# Patient Record
Sex: Female | Born: 1985 | Race: Black or African American | Hispanic: No | Marital: Single | Smoking: Former smoker
Health system: Southern US, Community
[De-identification: ages and names within clinical notes are randomized; demographics above are authoritative.]

## PROBLEM LIST (undated history)

## (undated) HISTORY — PX: FINGER SURGERY: SHX640

---
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
## 2006-11-13 ENCOUNTER — Ambulatory Visit: Payer: Self-pay | Admitting: Internal Medicine

## 2006-11-13 ENCOUNTER — Ambulatory Visit (HOSPITAL_COMMUNITY): Admission: RE | Admit: 2006-11-13 | Discharge: 2006-11-13 | Payer: Self-pay | Admitting: Family Medicine

## 2010-04-06 ENCOUNTER — Emergency Department (HOSPITAL_COMMUNITY)
Admission: EM | Admit: 2010-04-06 | Discharge: 2010-04-06 | Payer: Self-pay | Source: Home / Self Care | Admitting: Family Medicine

## 2011-12-11 ENCOUNTER — Encounter (HOSPITAL_COMMUNITY): Payer: Self-pay | Admitting: *Deleted

## 2011-12-11 ENCOUNTER — Emergency Department (HOSPITAL_COMMUNITY)
Admission: EM | Admit: 2011-12-11 | Discharge: 2011-12-11 | Disposition: A | Discharge status: Home / Self Care | Payer: No Typology Code available for payment source | Attending: Emergency Medicine | Admitting: Emergency Medicine

## 2011-12-11 ENCOUNTER — Emergency Department (HOSPITAL_COMMUNITY): Payer: No Typology Code available for payment source

## 2011-12-11 DIAGNOSIS — Z87891 Personal history of nicotine dependence: Secondary | ICD-10-CM | POA: Insufficient documentation

## 2011-12-11 DIAGNOSIS — Z043 Encounter for examination and observation following other accident: Secondary | ICD-10-CM | POA: Insufficient documentation

## 2011-12-11 DIAGNOSIS — M25569 Pain in unspecified knee: Secondary | ICD-10-CM | POA: Diagnosis not present

## 2011-12-11 DIAGNOSIS — M25561 Pain in right knee: Secondary | ICD-10-CM
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
## 2011-12-12 NOTE — ED Provider Notes (Signed)
Medical screening examination/treatment/procedure(s) were performed by non-physician practitioner and as supervising physician I was immediately available for consultation/collaboration.  Toy Baker, MD 12/12/11 [phone]
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
## 2013-07-03 ENCOUNTER — Encounter (HOSPITAL_COMMUNITY): Payer: Self-pay | Admitting: Emergency Medicine

## 2013-07-03 ENCOUNTER — Emergency Department (HOSPITAL_COMMUNITY)
Admission: EM | Admit: 2013-07-03 | Discharge: 2013-07-03 | Disposition: A | Discharge status: Home / Self Care | Payer: No Typology Code available for payment source | Attending: Emergency Medicine | Admitting: Emergency Medicine

## 2013-07-03 DIAGNOSIS — K029 Dental caries, unspecified: Secondary | ICD-10-CM | POA: Insufficient documentation

## 2013-07-03 DIAGNOSIS — K0889 Other specified disorders of teeth and supporting structures: Secondary | ICD-10-CM

## 2013-07-03 DIAGNOSIS — Z87891 Personal history of nicotine dependence: Secondary | ICD-10-CM | POA: Insufficient documentation

## 2013-07-03 DIAGNOSIS — K089 Disorder of teeth and supporting structures, unspecified: Secondary | ICD-10-CM | POA: Insufficient documentation

## 2013-07-03 MED ORDER — MORPHINE SULFATE 4 MG/ML IJ SOLN: 4.0000 mg | Freq: Once | INTRAMUSCULAR | Status: DC

## 2013-07-03 MED ORDER — NAPROXEN 500 MG PO TABS: 500.0000 mg | ORAL_TABLET | Freq: Two times a day (BID) | ORAL | Status: DC

## 2013-07-03 MED ORDER — PENICILLIN V POTASSIUM 500 MG PO TABS: 500.0000 mg | ORAL_TABLET | Freq: Three times a day (TID) | ORAL | Status: DC

## 2013-07-03 MED ORDER — HYDROCODONE-ACETAMINOPHEN 5-325 MG PO TABS
1.0000 | ORAL_TABLET | Freq: Once | ORAL | Status: AC
  Administered 2013-07-03: 1 via ORAL
  Filled 2013-07-03: qty 1

## 2013-07-03 MED ORDER — HYDROCODONE-ACETAMINOPHEN 5-325 MG PO TABS: ORAL_TABLET | ORAL | Status: DC

## 2013-07-03 MED ORDER — ONDANSETRON HCL 4 MG/2ML IJ SOLN: 4.0000 mg | Freq: Once | INTRAMUSCULAR | Status: DC

## 2013-07-03 NOTE — ED Notes (Signed)
Patient presents stating she has been having tooth pain for about 2 weeks.  Has a bad tooth on the top and bottom left.

## 2013-07-03 NOTE — ED Provider Notes (Signed)
Medical screening examination/treatment/procedure(s) were performed by non-physician practitioner and as supervising physician I was immediately available for consultation/collaboration.   EKG Interpretation None       Olivia Mackielga M Tianni Escamilla, MD 07/03/13 1729

## 2013-07-03 NOTE — ED Provider Notes (Signed)
CSN: [id number]     Arrival date & time 07/03/13  [id number] History   First MD Initiated Contact with Patient 07/03/13 [phone]     Chief Complaint  Patient presents with  . Dental Pain     (Consider location/radiation/quality/duration/timing/severity/associated sxs/prior Treatment) HPI Comments: Patient presents with complaint of dental pain which has been intermittent for approximately 2 weeks. The pain is in her right upper and lower molars. She denies fever, neck swelling, trouble breathing. She has had some mild facial swelling at times, not currently, relieved with Tylenol. The onset of this condition was acute. The course is constant. Aggravating factors: none.  Patient is a 28 y.o. female presenting with tooth pain. The history is provided by the patient.  Dental Pain Associated symptoms: facial swelling   Associated symptoms: no fever, no headaches and no neck pain     History reviewed. No pertinent past medical history. Past Surgical History  Procedure Laterality Date  . Finger surgery     History reviewed. No pertinent family history. History  Substance Use Topics  . Smoking status: Former Games developermoker  . Smokeless tobacco: Never Used  . Alcohol Use: No   OB History   Grav Para Term Preterm Abortions TAB SAB Ect Mult Living                 Review of Systems  Constitutional: Negative for fever.  HENT: Positive for dental problem and facial swelling. Negative for ear pain, sore throat and trouble swallowing.   Respiratory: Negative for shortness of breath and stridor.   Musculoskeletal: Negative for neck pain.  Skin: Negative for color change.  Neurological: Negative for headaches.      Allergies  Review of patient's allergies indicates no known allergies.  Home Medications   Current Outpatient Rx  Name  Route  Sig  Dispense  Refill  . HYDROcodone-acetaminophen (NORCO/VICODIN) 5-325 MG per tablet      Take 1-2 tablets every 6 hours as needed for severe pain   12  tablet   0   . naproxen (NAPROSYN) 500 MG tablet   Oral   Take 1 tablet (500 mg total) by mouth 2 (two) times daily.   20 tablet   0   . penicillin v potassium (VEETID) 500 MG tablet   Oral   Take 1 tablet (500 mg total) by mouth 3 (three) times daily.   21 tablet   0    BP 143/90  Pulse 81  Temp(Src) 98 F (36.7 C) (Oral)  Resp 18  Ht 5\' 5"  (1.651 m)  Wt 220 lb (99.791 kg)  BMI 36.61 kg/m2  SpO2 97%  LMP 05/30/2013 Physical Exam  Nursing note and vitals reviewed. Constitutional: She appears well-developed and well-nourished.  HENT:  Head: Normocephalic and atraumatic.  Right Ear: Tympanic membrane, external ear and ear canal normal.  Left Ear: Tympanic membrane, external ear and ear canal normal.  Nose: Nose normal.  Mouth/Throat: Uvula is midline, oropharynx is clear and moist and mucous membranes are normal. No trismus in the jaw. Abnormal dentition. Dental caries present. No dental abscesses or uvula swelling. No tonsillar abscesses.  Patient with R maxillary and mandibular tooth pain and tenderness to palpation in area of 3rd molars. Multiple caries noted especially of left molars. No swelling or erythema noted on exam.  Eyes: Conjunctivae are normal.  Neck: Normal range of motion. Neck supple.  No neck swelling or Ludwig's angina  Lymphadenopathy:    She has no cervical adenopathy.  Neurological: She is alert.  Skin: Skin is warm and dry.  Psychiatric: She has a normal mood and affect.    ED Course  Procedures (including critical care time) Labs Review Labs Reviewed - No data to display Imaging Review No results found.   EKG Interpretation None      6:48 AM Patient seen and examined. Medications ordered.   Vital signs reviewed and are as follows: Filed Vitals:   07/03/13 0645  BP: 143/90  Pulse: 81  Temp:   Resp: 18   Patient counseled to take prescribed medications as directed, return with worsening facial or neck swelling, and to follow-up  with their dentist as soon as possible.   Antibiotics if facial swelling returns.   Patient counseled on use of narcotic pain medications. Counseled not to combine these medications with others containing tylenol. Urged not to drink alcohol, drive, or perform any other activities that requires focus while taking these medications. The patient verbalizes understanding and agrees with the plan.   MDM   Final diagnoses:  Pain, dental   Patient with toothache. No fever. Exam unconcerning for Ludwig's angina or other deep tissue infection in neck.   No facial swelling at current time or other evidence of infection -- but given report of facial swelling, patient rx penicillin and will fill if she develops facial swelling.      Renne Crigler, PA-C 07/03/13 [phone]

## 2013-07-03 NOTE — Discharge Instructions (Signed)
Please read and follow all provided instructions.  Your diagnoses today include:  1. Pain, dental     The exam and treatment you received today has been provided on an emergency basis only. This is not a substitute for complete medical or dental care.  Tests performed today include:  Vital signs. See below for your results today.   Medications prescribed:   Vicodin (hydrocodone/acetaminophen) - narcotic pain medication  DO NOT drive or perform any activities that require you to be awake and alert because this medicine can make you drowsy. BE VERY CAREFUL not to take multiple medicines containing Tylenol (also called acetaminophen). Doing so can lead to an overdose which can damage your liver and cause liver failure and possibly death.   Naproxen - anti-inflammatory pain medication  Do not exceed 500mg  naproxen every 12 hours, take with food  You have been prescribed an anti-inflammatory medication or NSAID. Take with food. Take smallest effective dose for the shortest duration needed for your pain. Stop taking if you experience stomach pain or vomiting.    Penicillin - antibiotic, fill if you have facial swelling  You have been prescribed an antibiotic medicine: take the entire course of medicine even if you are feeling better. Stopping early can cause the antibiotic not to work.  Take any prescribed medications only as directed.  Home care instructions:  Follow any educational materials contained in this packet.  Follow-up instructions: Please follow-up with your dentist for further evaluation of your symptoms. If you do not have a dentist or primary care doctor -- see below for referral information.   Dental Assistance: See below for dental referrals  Return instructions:   Please return to the Emergency Department if you experience worsening symptoms.  Please return if you develop a fever, you develop more swelling in your face or neck, you have trouble breathing or  swallowing food.  Please return if you have any other emergent concerns.  Additional Information:  Your vital signs today were: BP 123/83   Pulse 84   Temp(Src) 98 F (36.7 C) (Oral)   Resp 18   Ht 5\' 5"  (1.651 m)   Wt 220 lb (99.791 kg)   BMI 36.61 kg/m2   SpO2 100%   LMP 05/30/2013 If your blood pressure (BP) was elevated above 135/85 this visit, please have this repeated by your doctor within one month. -------------- Dental Care: Organization         Address  Phone  Notes  [facility] Department of [facility] [address] [place], [place] [phone] Accepts children up to age 16 who are enrolled in [place] or [place] Health Choice; pregnant women with a Medicaid card; and children who have applied for Medicaid or [place] Health Choice, but were declined, whose parents can pay a reduced fee at time of service.  [facility] Department of [facility] [place]  [address], [place] [phone] Accepts children up to age 37 who are enrolled in [place] or [place] Health Choice; pregnant women with a Medicaid card; and children who have applied for Medicaid or [place] Health Choice, but were declined, whose parents can pay a reduced fee at time of service.  [facility]  [address] [place], [place] [phone] Patients are seen by appointment only. Walk-ins are not accepted. [facility] will see patients 34 years of age and older. Monday - Tuesday (8am-5pm) Most Wednesdays (8:30-5pm) $30 per visit, cash only  [facility]  [address], [facility] [place] [facility] [phone] Patients are seen by appointment only. Walk-ins are not accepted. [facility] will see patients 27 years of age and older. One Wednesday Evening (Monthly: Volunteer Based).  $30 per visit, cash only  [facility]  [phone] for adults; Children under age 74, call Graduate Pediatric Dentistry at [phone]  [phone]. Children aged 934-14, please call [phone] to request a pediatric application.  Dental services are provided in all areas of dental care including fillings, crowns and bridges, complete and partial dentures, implants, gum treatment, root canals, and extractions. Preventive care is also provided. Treatment is provided to both adults and children. Patients are selected via a lottery and there is often a waiting list.   [facility] [address], [place]  [phone] [email or web address]   [facility] [address], [place], [place] [phone], [phone]. [phone] Second and Fourth Thursday of each month, opens at 6:30 AM; Clinic ends at 9 AM.  Patients are seen on a first-come first-served basis, and a limited number are seen during each clinic.   [facility]  [address], [place], [place] [phone]   Eligibility Requirements You must have lived in [place], [place], or [place] for at least the last three months.   You cannot be eligible for state or federal sponsored [place] insurance, including [employer], [place], or [employer]icare.   You generally cannot be eligible for healthcare insurance through your employer.    How to apply: Eligibility screenings are held every Tuesday and Wednesday afternoon from 1:00 pm until 4:00 pm. You do not need an appointment for the interview!  [facility] [address], [place], [place] [phone]   [facility]  [phone]   [facility]  [phone]   [facility]  [phone]
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
## 2013-12-04 ENCOUNTER — Encounter (HOSPITAL_COMMUNITY): Payer: Self-pay | Admitting: Emergency Medicine

## 2013-12-04 ENCOUNTER — Emergency Department (HOSPITAL_COMMUNITY)
Admission: EM | Admit: 2013-12-04 | Discharge: 2013-12-04 | Disposition: A | Discharge status: Home / Self Care | Payer: No Typology Code available for payment source | Attending: Emergency Medicine | Admitting: Emergency Medicine

## 2013-12-04 DIAGNOSIS — Z79899 Other long term (current) drug therapy: Secondary | ICD-10-CM | POA: Insufficient documentation

## 2013-12-04 DIAGNOSIS — K0889 Other specified disorders of teeth and supporting structures: Secondary | ICD-10-CM

## 2013-12-04 DIAGNOSIS — K089 Disorder of teeth and supporting structures, unspecified: Secondary | ICD-10-CM | POA: Insufficient documentation

## 2013-12-04 DIAGNOSIS — Z87891 Personal history of nicotine dependence: Secondary | ICD-10-CM | POA: Insufficient documentation

## 2013-12-04 MED ORDER — TRAMADOL HCL 50 MG PO TABS: 50.0000 mg | ORAL_TABLET | Freq: Four times a day (QID) | ORAL | Status: DC | PRN

## 2013-12-04 MED ORDER — HYDROCODONE-ACETAMINOPHEN 5-325 MG PO TABS
2.0000 | ORAL_TABLET | Freq: Once | ORAL | Status: AC
  Administered 2013-12-04: 2 via ORAL
  Filled 2013-12-04: qty 2

## 2013-12-04 NOTE — ED Provider Notes (Signed)
CSN: [id number]     Arrival date & time 12/04/13  1429 History  This chart was scribed for Sharilyn SitesLisa Sanders, working with Ethelda ChickMartha K Linker, MD found by Elon SpannerGarrett Fishbaugh, ED Scribe. This patient was seen in room WTR7/WTR7 and the patient's care was started at 3:12 PM.    Chief Complaint  Patient presents with  . Dental Pain   The history is provided by the patient. No language interpreter was used.    HPI Comments: Karen Roy is a 28 y.o. female who presents to the Emergency Department complaining of rapidly worsening lower left dental pain onset today.  She reports that her jaw feels swollen near the affected area currently.  She reports taking tylenol within the hour.  Patient denies fever, chills.  NKA.  Patient is not currently established with dentist.    History reviewed. No pertinent past medical history. Past Surgical History  Procedure Laterality Date  . Finger surgery     No family history on file. History  Substance Use Topics  . Smoking status: Former Games developermoker  . Smokeless tobacco: Never Used  . Alcohol Use: No   OB History   Grav Para Term Preterm Abortions TAB SAB Ect Mult Living                 Review of Systems  HENT: Positive for dental problem.   All other systems reviewed and are negative.     Allergies  Review of patient's allergies indicates no known allergies.  Home Medications   Prior to Admission medications   Medication Sig Start Date End Date Taking? Authorizing Provider  HYDROcodone-acetaminophen (NORCO/VICODIN) 5-325 MG per tablet Take 1-2 tablets every 6 hours as needed for severe pain 07/03/13   Renne CriglerJoshua Geiple, PA-C  naproxen (NAPROSYN) 500 MG tablet Take 1 tablet (500 mg total) by mouth 2 (two) times daily. 07/03/13   Renne CriglerJoshua Geiple, PA-C  penicillin v potassium (VEETID) 500 MG tablet Take 1 tablet (500 mg total) by mouth 3 (three) times daily. 07/03/13   Renne CriglerJoshua Geiple, PA-C   BP 151/78  Pulse 77  Temp(Src) 98.6 F (37 C) (Oral)  Resp 16  SpO2  100%  LMP 12/01/2013 Physical Exam  Nursing note and vitals reviewed. Constitutional: She is oriented to person, place, and time. She appears well-developed and well-nourished. No distress.  HENT:  Head: Normocephalic and atraumatic.  Mouth/Throat: Uvula is midline, oropharynx is clear and moist and mucous membranes are normal. Abnormal dentition. Dental caries present. No dental abscesses.  Teeth largely in poor dentition, left lower molar with large cavity present, surrounding gingiva normal in appearance without signs of dental abscess, handling secretions appropriately, no trismus  Eyes: Conjunctivae and EOM are normal. Pupils are equal, round, and reactive to light.  Neck: Normal range of motion. Neck supple.  Cardiovascular: Normal rate, regular rhythm and normal heart sounds.   Pulmonary/Chest: Effort normal and breath sounds normal. No respiratory distress. She has no wheezes.  Musculoskeletal: Normal range of motion.  Neurological: She is alert and oriented to person, place, and time.  Skin: Skin is warm and dry. She is not diaphoretic.  Psychiatric: She has a normal mood and affect.    ED Course  Procedures (including critical care time)  DIAGNOSTIC STUDIES: Oxygen Saturation is 100% on RA, normal by my interpretation.    COORDINATION OF CARE:  3:18 PM Discussed treatment plan with patient at bedside.  Patient acknowledges and agrees with plan.    Labs Review Labs Reviewed -  No data to display  Imaging Review No results found.   EKG Interpretation None      MDM   Final diagnoses:  Pain, dental   Dental pain without signs of dental abscess, no airway compromise noted.  Rx tramadol, will FU with dentist.  Referral and dental clinic flyer given.  Discussed plan with patient, he/she acknowledged understanding and agreed with plan of care.  Return precautions given for new or worsening symptoms.  I personally performed the services described in this documentation,  which was scribed in my presence. The recorded information has been reviewed and is accurate.  Garlon Hatchet, PA-C 12/04/13 [phone]

## 2013-12-04 NOTE — ED Notes (Signed)
Pt reports left lower dental pain with swelling to left side of her face. Pain 10/10. "I have cavity on that side that has not been filled".

## 2013-12-04 NOTE — Discharge Instructions (Signed)
Take the prescribed medication as directed. Follow-up with dentist.  See flyer for dental clinic that is attached. Return to the ED for new or worsening symptoms.

## 2013-12-04 NOTE — ED Provider Notes (Signed)
Medical screening examination/treatment/procedure(s) were performed by non-physician practitioner and as supervising physician I was immediately available for consultation/collaboration.   EKG Interpretation None       Ethelda ChickMartha K Linker, MD 12/04/13 1555

## 2017-02-07 ENCOUNTER — Other Ambulatory Visit (HOSPITAL_COMMUNITY)
Admission: RE | Admit: 2017-02-07 | Discharge: 2017-02-07 | Disposition: A | Discharge status: Home / Self Care | Payer: BC Managed Care – PPO | Source: Ambulatory Visit | Attending: Family Medicine | Admitting: Family Medicine

## 2017-02-07 ENCOUNTER — Other Ambulatory Visit: Payer: Self-pay | Admitting: Family Medicine

## 2017-02-07 DIAGNOSIS — Z01419 Encounter for gynecological examination (general) (routine) without abnormal findings: Secondary | ICD-10-CM | POA: Diagnosis present

## 2017-02-08 LAB — CYTOLOGY - PAP
Adequacy: ABSENT
Diagnosis: NEGATIVE

## 2018-02-17 ENCOUNTER — Ambulatory Visit (HOSPITAL_COMMUNITY)
Admission: EM | Admit: 2018-02-17 | Discharge: 2018-02-17 | Disposition: A | Discharge status: Home / Self Care | Payer: BC Managed Care – PPO | Attending: Physician Assistant | Admitting: Physician Assistant

## 2018-02-17 ENCOUNTER — Encounter (HOSPITAL_COMMUNITY): Payer: Self-pay | Admitting: Emergency Medicine

## 2018-02-17 ENCOUNTER — Telehealth (HOSPITAL_COMMUNITY): Payer: Self-pay | Admitting: Emergency Medicine

## 2018-02-17 ENCOUNTER — Other Ambulatory Visit: Payer: Self-pay

## 2018-02-17 DIAGNOSIS — K047 Periapical abscess without sinus: Secondary | ICD-10-CM | POA: Diagnosis not present

## 2018-02-17 MED ORDER — CLINDAMYCIN HCL 150 MG PO CAPS: 150.0000 mg | ORAL_CAPSULE | Freq: Four times a day (QID) | ORAL | 0 refills | Status: DC

## 2018-02-17 MED ORDER — HYDROCODONE-ACETAMINOPHEN 5-325 MG PO TABS: 1.0000 | ORAL_TABLET | Freq: Four times a day (QID) | ORAL | 0 refills | Status: DC | PRN

## 2018-02-17 NOTE — ED Provider Notes (Signed)
02/17/2018 4:30 PM   DOB: 1986-01-26 / MRN: [id number]  SUBJECTIVE:  Karen Roy is a 32 y.o. female presenting for right sided upper gingival pain that started about 3 days ago. NO fever. History of poor dentition.  She has No Known Allergies.   She  has no past medical history on file.    She  reports that she has quit smoking. She has never used smokeless tobacco. She reports that she does not drink alcohol or use drugs. She  has no sexual activity history on file. The patient  has a past surgical history that includes Finger surgery.  Her family history is not on file.  ROS Per HPI.  OBJECTIVE:    Wt Readings from Last 3 Encounters:  07/03/13 220 lb (99.8 kg)   Temp Readings from Last 3 Encounters:  12/04/13 98.6 F (37 C) (Oral)  07/03/13 98 F (36.7 C) (Oral)  12/11/11 99.5 F (37.5 C)   BP Readings from Last 3 Encounters:  12/04/13 151/78  07/03/13 143/90  12/11/11 108/79   Pulse Readings from Last 3 Encounters:  12/04/13 63  07/03/13 81  12/11/11 85    Physical Exam  Constitutional: She is oriented to person, place, and time. She appears well-nourished. No distress.  HENT:  Mouth/Throat:    Eyes: Pupils are equal, round, and reactive to light. EOM are normal.  Cardiovascular: Normal rate.  Pulmonary/Chest: Effort normal.  Abdominal: She exhibits no distension.  Neurological: She is alert and oriented to person, place, and time. No cranial nerve deficit. Gait normal.  Skin: Skin is dry. She is not diaphoretic.  Psychiatric: She has a normal mood and affect.  Vitals reviewed.   No results found for this or any previous visit (from the past 72 hour(s)).  No results found.  ASSESSMENT AND PLAN:   Tooth abscess  Discharge Instructions   None        The patient is advised to call or return to clinic if she does not see an improvement in symptoms, or to seek the care of the closest emergency department if she worsens with the above plan.     Deliah Boston, MHS, PA-C 02/17/2018 4:30 PM   Ofilia Neas, PA-C 02/17/18 1631

## 2018-02-17 NOTE — ED Triage Notes (Signed)
Seen by provider first. 

## 2018-09-10 ENCOUNTER — Encounter (HOSPITAL_COMMUNITY): Payer: Self-pay

## 2018-09-10 ENCOUNTER — Ambulatory Visit (HOSPITAL_COMMUNITY)
Admission: EM | Admit: 2018-09-10 | Discharge: 2018-09-10 | Disposition: A | Discharge status: Home / Self Care | Payer: BC Managed Care – PPO | Attending: Family Medicine | Admitting: Family Medicine

## 2018-09-10 ENCOUNTER — Other Ambulatory Visit: Payer: Self-pay

## 2018-09-10 DIAGNOSIS — K047 Periapical abscess without sinus: Secondary | ICD-10-CM

## 2018-09-10 MED ORDER — PENICILLIN V POTASSIUM 500 MG PO TABS: 500.0000 mg | ORAL_TABLET | Freq: Four times a day (QID) | ORAL | 0 refills | Status: AC

## 2018-09-10 MED ORDER — IBUPROFEN 800 MG PO TABS: 800.0000 mg | ORAL_TABLET | Freq: Three times a day (TID) | ORAL | 0 refills | Status: AC

## 2018-09-10 NOTE — ED Triage Notes (Signed)
Patient presents to Urgent Care with complaints of left upper dental pain since today while eating lunch. Patient reports she thinks there is an abscess on her gums.

## 2018-09-10 NOTE — ED Provider Notes (Signed)
[facility]    CSN: [id number] Arrival date & time: 09/10/18  1127     History   Chief Complaint Chief Complaint  Patient presents with  . Dental Pain    HPI Karen Roy is a 33 y.o. female.   HPI  Has pain with chewing and today noted a large painful lump next to the upper molar on the left No fever No face swelling pain controlled with ibuprofen  History reviewed. No pertinent past medical history.  There are no active problems to display for this patient.   Past Surgical History:  Procedure Laterality Date  . FINGER SURGERY      OB History   No obstetric history on file.      Home Medications    Prior to Admission medications   Medication Sig Start Date End Date Taking? Authorizing Provider  ibuprofen (ADVIL) 800 MG tablet Take 1 tablet (800 mg total) by mouth 3 (three) times daily. 09/10/18   Eustace Moore, MD  penicillin v potassium (VEETID) 500 MG tablet Take 1 tablet (500 mg total) by mouth 4 (four) times daily for 10 days. 09/10/18 09/20/18  Eustace Moore, MD    Family History Family History  Problem Relation Age of Onset  . Healthy Mother   . Healthy Father     Social History Social History   Tobacco Use  . Smoking status: Former Games developer  . Smokeless tobacco: Never Used  Substance Use Topics  . Alcohol use: No  . Drug use: No     Allergies   Patient has no known allergies.   Review of Systems Review of Systems  Constitutional: Negative for chills and fever.  HENT: Positive for dental problem. Negative for ear pain and sore throat.   Eyes: Negative for pain and visual disturbance.  Respiratory: Negative for cough and shortness of breath.   Cardiovascular: Negative for chest pain and palpitations.  Gastrointestinal: Negative for abdominal pain and vomiting.  Genitourinary: Negative for dysuria and hematuria.  Musculoskeletal: Negative for arthralgias and back pain.  Skin: Negative for color change and rash.   Neurological: Negative for seizures and syncope.  All other systems reviewed and are negative.    Physical Exam Triage Vital Signs ED Triage Vitals  Enc Vitals Group     BP 09/10/18 1234 125/79     Pulse Rate 09/10/18 1234 62     Resp 09/10/18 1234 18     Temp 09/10/18 1234 98.2 F (36.8 C)     Temp Source 09/10/18 1234 Oral     SpO2 09/10/18 1234 100 %     Weight --      Height --      Head Circumference --      Peak Flow --      Pain Score 09/10/18 1233 6     Pain Loc --      Pain Edu? --      Excl. in GC? --    No data found.  Updated Vital Signs BP 125/79 (BP Location: Left Arm)   Pulse 62   Temp 98.2 F (36.8 C) (Oral)   Resp 18   SpO2 100%     Physical Exam Constitutional:      General: She is not in acute distress.    Appearance: She is well-developed.  HENT:     Head: Normocephalic and atraumatic.     Mouth/Throat:   Eyes:     Conjunctiva/sclera: Conjunctivae normal.  Pupils: Pupils are equal, round, and reactive to light.  Neck:     Musculoskeletal: Normal range of motion.  Cardiovascular:     Rate and Rhythm: Normal rate.  Pulmonary:     Effort: Pulmonary effort is normal. No respiratory distress.  Abdominal:     General: There is no distension.     Palpations: Abdomen is soft.  Musculoskeletal: Normal range of motion.  Skin:    General: Skin is warm and dry.  Neurological:     Mental Status: She is alert.      UC Treatments / Results  Labs (all labs ordered are listed, but only abnormal results are displayed) Labs Reviewed - No data to display  EKG None  Radiology No results found.  Procedures Procedures (including critical care time)  Medications Ordered in UC Medications - No data to display  Initial Impression / Assessment and Plan / UC Course  I have reviewed the triage vital signs and the nursing notes.  Pertinent labs & imaging results that were available during my care of the patient were reviewed by me and  considered in my medical decision making (see chart for details).      Final Clinical Impressions(s) / UC Diagnoses   Final diagnoses:  Dental abscess     Discharge Instructions     Take penicillin 4 times a day Take the ibuprofen as needed for pain Call the dentist to arrange follow-up You may stop the penicillin when you are swelling and pain   ED Prescriptions    Medication Sig Dispense Auth. Provider   penicillin v potassium (VEETID) 500 MG tablet Take 1 tablet (500 mg total) by mouth 4 (four) times daily for 10 days. 40 tablet Eustace MooreNelson, Saahas Hidrogo Sue, MD   ibuprofen (ADVIL) 800 MG tablet Take 1 tablet (800 mg total) by mouth 3 (three) times daily. 21 tablet Eustace MooreNelson, Shikira Folino Sue, MD     Controlled Substance Prescriptions [place] Controlled Substance Registry consulted? Not Applicable   Eustace MooreNelson, Konni Kesinger Sue, MD 09/10/18 1301

## 2018-09-10 NOTE — Discharge Instructions (Signed)
Take penicillin 4 times a day Take the ibuprofen as needed for pain Call the dentist to arrange follow-up You may stop the penicillin when you are swelling and pain

## 2019-12-10 ENCOUNTER — Ambulatory Visit (INDEPENDENT_AMBULATORY_CARE_PROVIDER_SITE_OTHER): Payer: BC Managed Care – PPO | Admitting: Family Medicine

## 2019-12-10 ENCOUNTER — Encounter: Payer: Self-pay | Admitting: Family Medicine

## 2019-12-10 ENCOUNTER — Ambulatory Visit (INDEPENDENT_AMBULATORY_CARE_PROVIDER_SITE_OTHER): Payer: BC Managed Care – PPO

## 2019-12-10 ENCOUNTER — Ambulatory Visit: Payer: Self-pay

## 2019-12-10 ENCOUNTER — Other Ambulatory Visit: Payer: Self-pay

## 2019-12-10 VITALS — BP 130/84 | HR 70 | Ht 65.0 in | Wt 225.8 lb

## 2019-12-10 DIAGNOSIS — M25562 Pain in left knee: Secondary | ICD-10-CM

## 2019-12-10 DIAGNOSIS — G8929 Other chronic pain: Secondary | ICD-10-CM

## 2019-12-10 NOTE — Patient Instructions (Signed)
Thank you for coming in today. Plan for xray today.  Use voltaren gel.  Work on Systems developer.  Recheck in about 6 weeks unless better.   Can do injection at any time.   Compression sleeve could help.   I recommend you obtained a compression sleeve to help with your joint problems. There are many options on the market however I recommend obtaining a full knee Body Helix compression sleeve.  You can find information (including how to appropriate measure yourself for sizing) can be found at www.[employer].ch.  Many of these products are health savings account (HSA) eligible.   You can use the compression sleeve at any time throughout the day but is most important to use while being active as well as for 2 hours post-activity.   It is appropriate to ice following activity with the compression sleeve in place.    Patellar Tendinitis  Patellar tendinitis is also called jumper's knee or patellar tendinopathy. This condition happens when there is damage to and inflammation of the patellar tendon. Tendons are cord-like tissues that connect muscles to bones. The patellar tendon connects the bottom of the kneecap (patella) to the top of the shin bone (tibia). Patellar tendinitis causes pain in the front of the knee. The condition happens in the following stages:  Stage 1: In this stage, you have pain only after activity.  Stage 2: In this stage, you have pain during and after activity.  Stage 3: In this stage, you have pain at rest as well as during and after activity. The pain limits your ability to do the activity.  Stage 4: In this stage, the tendon tears and severely limits your activity. What are the causes? This condition is caused by repeated (repetitive) stress on the tendon. This stress may cause the tendon to stretch, swell, thicken, or tear. What increases the risk? The following factors may make you more likely to develop this condition:  Participating in sports that involve  running, kicking, and jumping, especially on hard surfaces. These include: ? Basketball. ? Volleyball. ? Soccer. ? Track and field.  Training too hard.  Having tight thigh muscles.  Having received steroid injections in the tendon.  Having had knee surgery.  Being 41-59 years old.  Having rheumatoid arthritis, diabetes, or kidney disease. These conditions interrupt blood flow to the knee, causing the tendon to weaken. What are the signs or symptoms? The main symptom of this condition is pain and swelling in the front of the knee. The pain usually starts slowly and gradually gets worse. It may become painful to straighten your leg. The pain may get worse when you walk, run, or jump. How is this diagnosed? This condition may be diagnosed based on:  Your symptoms.  Your medical history.  A physical exam. During the physical exam, your health care provider may check for: ? Tenderness in your patella. ? Tightness in your thigh muscles. ? Pain when you straighten your knee.  Imaging tests, including: ? X-rays. These will show the position and condition of your patella. ? An MRI. This will show any abnormality of the tendon. ? Ultrasound. This will show any swelling or other abnormalities of the tendon. How is this treated? Treatment for this condition depends on the stage of the condition. It may involve:  Avoiding activities that cause pain, such as jumping.  Icing and elevating your knee.  Having sound wave stimulation to promote healing.  Doing stretching and strengthening exercises (physical therapy) when pain and  swelling improve.  Wearing a knee brace. This may be needed if your condition does not improve with treatment.  Using crutches or a walker. This may be needed if your condition does not improve with treatment.  Surgery. This may be done if you have stage 4 tendinitis. Follow these instructions at home: If you have a brace:  Wear the brace as told by your  health care provider. Remove it only as told by your health care provider.  Loosen the brace if your toes tingle, become numb, or turn cold and blue.  Keep the brace clean.  If the brace is not waterproof: ? Do not let it get wet. ? Cover it with a watertight covering when you take a bath or shower.  Ask your health care provider when it is safe for you to drive. Managing pain, stiffness, and swelling   If directed, put ice on the injured area. ? If you have a removable brace, remove it as told by your health care provider. ? Put ice in a plastic bag. ? Place a towel between your skin and the bag. ? Leave the ice on for 20 minutes, 2-3 times a day.  Move your toes often to reduce stiffness and swelling.  Raise (elevate) your knee above the level of your heart while you are sitting or lying down. Activity  Do not use the injured limb to support your body weight until your health care provider says that you can. Use your crutches or a walker as told by your health care provider.  Return to your normal activities as told by your health care provider. Ask your health care provider what activities are safe for you.  Do exercises as told by your health care provider or physical therapist. General instructions  Take over-the-counter and prescription medicines only as told by your health care provider.  Do not use any products that contain nicotine or tobacco, such as cigarettes, e-cigarettes, and chewing tobacco. These can delay healing. If you need help quitting, ask your health care provider.  Keep all follow-up visits as told by your health care provider. This is important. How is this prevented?  Warm up and stretch before being active.  Cool down and stretch after being active.  Give your body time to rest between periods of activity. ? You may need to reduce how often you play a sport that requires frequent jumping.  Make sure to use equipment that fits you.  Be safe  and responsible while being active. This will help you avoid falls which can damage the tendon.  Do at least 150 minutes of moderate-intensity exercise each week, such as brisk walking or water aerobics.  Maintain physical fitness, including: ? Strength. ? Flexibility. ? Cardiovascular fitness. ? Endurance. Contact a health care provider if:  Your symptoms have not improved in 6 weeks.  Your symptoms get worse. Summary  Patellar tendinitis is also called jumper's knee or patellar tendinopathy. This condition happens when there is damage to and inflammation of the patellar tendon.  Treatment for this condition depends on the stage of the condition and may include rest, ice, exercises, medicines, and surgery.  Do not use the injured limb to support your body weight until your health care provider says that you can.  Take over-the-counter and prescription medicines only as told by your health care provider.  Keep all follow-up visits as told by your health care provider. This is important. This information is not intended to replace advice given  to you by your health care provider. Make sure you discuss any questions you have with your health care provider. Document Revised: 07/25/2018 Document Reviewed: 02/25/2018 Elsevier Patient Education  2020 [employer].

## 2019-12-10 NOTE — Progress Notes (Signed)
Subjective:    CC: L knee pain  I, Molly Weber, LAT, ATC, am serving as scribe for Dr. Clementeen Graham.  HPI: Pt is a 34 y/o female presenting w/ c/o chronic L knee pain since high school that has worsened over the last 2 months.  She locates her pain to her L ant knee.  She had an injury in 2008 that was diagnosed as a small impaction fracture anterior lateral tibial plateau along with contusion.  Additionally she had a possible small lateral meniscus tear.  She notes pain is mostly anterior now.  Pain worse with activity including bending and squatting.  She like to start exercising and wants to make sure her knee will get her.  Radiating pain: No L knee swelling: yes L knee mechanical symptoms: yes Aggravating factors: prolonged standing / weight bearing Treatments tried: IBU  Pertinent review of Systems: No fevers or chills  Relevant historical information: History knee injury 2008   Objective:    Vitals:   12/10/19 1102  BP: 130/84  Pulse: 70  SpO2: 98%   General: Well Developed, well nourished, and in no acute distress.   MSK: Knee normal-appearing with no effusion. Normal motion without crepitation. Palpable squeak overlying patella tendon. Tender palpation mildly anterior knee and medial joint line. Stable ligaments exam. Negative Murray's test. Intact strength.  Lab and Radiology Results  Diagnostic Limited MSK Ultrasound of: Left knee Quad tendon intact normal-appearing Trace joint effusion superior patellar space. Patellar tendon intact normal. Hypoechoic fluid tracking deep to patellar tendon superficial to Hoffa's fat pad. Lateral joint line normal. Medial joint line narrowed degenerative with partial extruded medial meniscus. 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  No significant DJD Await formal  radiology review  Impression and Recommendations:    Assessment and Plan: 34 y.o. female with left knee pain due to degenerative changes and some bursitis and patellar tendinitis. Plan for home exercise program Voltaren gel and compressive knee sleeve.  Check back in about 6 weeks.  If not improved would consider injection or physical therapy or both.   Orders Placed This Encounter  Procedures  . [place] LIMITED JOINT SPACE STRUCTURES LOW LEFT(NO LINKED CHARGES)    Order Specific Question:   Reason for Exam (SYMPTOM  OR DIAGNOSIS REQUIRED)    Answer:   L knee pain    Order Specific Question:   Preferred imaging location?    Answer:   Adult nurse [facility]  . DG Knee AP/LAT W/Sunrise Left    Standing Status:   Future    Number of Occurrences:   1    Standing Expiration Date:   12/09/2020    Order Specific Question:   Reason for Exam (SYMPTOM  OR DIAGNOSIS REQUIRED)    Answer:   eval knee pain    Order Specific Question:   Is patient pregnant?    Answer:   No    Order Specific Question:   Preferred imaging location?    Answer:   [facility]    Order Specific Question:   Radiology Contrast Protocol - do NOT remove file path    Answer:   [email or web address]   No orders of the defined types were placed in this encounter.   Discussed warning signs or symptoms. Please see discharge instructions. Patient expresses understanding.   The above documentation has been reviewed and is accurate  and complete Lynne Leader, M.D.

## 2019-12-11 NOTE — Progress Notes (Signed)
X-ray left knee looks pretty normal to radiology

## 2020-01-07 ENCOUNTER — Ambulatory Visit: Payer: BC Managed Care – PPO | Admitting: Family Medicine
